# Patient Record
Sex: Male | Born: 1935 | Race: White | Hispanic: No | Marital: Married
Health system: Southern US, Community
[De-identification: ages and names within clinical notes are randomized; demographics above are authoritative.]

---

## 2011-06-23 ENCOUNTER — Emergency Department: Payer: Self-pay | Admitting: Internal Medicine

## 2011-06-23 LAB — COMPREHENSIVE METABOLIC PANEL
Alkaline Phosphatase: 72 U/L (ref 50–136)
Anion Gap: 9 (ref 7–16)
Bilirubin,Total: 0.7 mg/dL (ref 0.2–1.0)
Calcium, Total: 8.8 mg/dL (ref 8.5–10.1)
Chloride: 103 mmol/L (ref 98–107)
Co2: 29 mmol/L (ref 21–32)
EGFR (African American): >60
EGFR (Non-African Amer.): >60
Osmolality: 283 (ref 275–301)
Potassium: 3.8 mmol/L (ref 3.5–5.1)
SGOT(AST): 21 U/L (ref 15–37)
Sodium: 141 mmol/L (ref 136–145)

## 2011-06-23 LAB — URINALYSIS, COMPLETE
Glucose,UR: NEGATIVE mg/dL (ref 0–75)
Nitrite: POSITIVE
Ph: 7 (ref 4.5–8.0)
Transitional Epi: 1

## 2011-06-23 LAB — CBC
MCH: 38.6 pg — ABNORMAL HIGH (ref 26.0–34.0)
MCHC: 35.4 g/dL (ref 32.0–36.0)
MCV: 109 fL — ABNORMAL HIGH (ref 80–100)
Platelet: 156 10*3/uL (ref 150–440)
RBC: 3.13 10*6/uL — ABNORMAL LOW (ref 4.40–5.90)
RDW: 14.5 % (ref 11.5–14.5)

## 2011-06-23 LAB — PROTIME-INR: INR: 1

## 2011-06-25 LAB — URINE CULTURE

## 2011-08-11 ENCOUNTER — Emergency Department: Payer: Self-pay | Admitting: Emergency Medicine

## 2011-08-12 LAB — CBC
HCT: 26.5 % — ABNORMAL LOW (ref 40.0–52.0)
MCH: 38 pg — ABNORMAL HIGH (ref 26.0–34.0)
MCV: 113 fL — ABNORMAL HIGH (ref 80–100)
Platelet: 165 10*3/uL (ref 150–440)
RBC: 2.34 10*6/uL — ABNORMAL LOW (ref 4.40–5.90)
RDW: 16.4 % — ABNORMAL HIGH (ref 11.5–14.5)

## 2011-08-12 LAB — URINALYSIS, COMPLETE
Bilirubin,UR: NEGATIVE
Glucose,UR: 50 mg/dL (ref 0–75)
Nitrite: POSITIVE
RBC,UR: 28 /HPF (ref 0–5)
Specific Gravity: 1.018 (ref 1.003–1.030)
WBC UR: 1326 /HPF (ref 0–5)

## 2011-08-12 LAB — COMPREHENSIVE METABOLIC PANEL
Alkaline Phosphatase: 66 U/L (ref 50–136)
Bilirubin,Total: 1 mg/dL (ref 0.2–1.0)
Calcium, Total: 8.6 mg/dL (ref 8.5–10.1)
Chloride: 104 mmol/L (ref 98–107)
Co2: 29 mmol/L (ref 21–32)
Creatinine: 0.99 mg/dL (ref 0.60–1.30)
EGFR (African American): >60
EGFR (Non-African Amer.): >60
Osmolality: 301 (ref 275–301)
SGOT(AST): 37 U/L (ref 15–37)
SGPT (ALT): 28 U/L
Sodium: 144 mmol/L (ref 136–145)

## 2011-08-12 LAB — DRUG SCREEN, URINE
Amphetamines, Ur Screen: NEGATIVE (ref ?–1000)
Barbiturates, Ur Screen: NEGATIVE (ref ?–200)
Benzodiazepine, Ur Scrn: NEGATIVE (ref ?–200)
Cannabinoid 50 Ng, Ur ~~LOC~~: NEGATIVE (ref ?–50)
MDMA (Ecstasy)Ur Screen: NEGATIVE (ref ?–500)
Methadone, Ur Screen: NEGATIVE (ref ?–300)
Opiate, Ur Screen: NEGATIVE (ref ?–300)
Phencyclidine (PCP) Ur S: NEGATIVE (ref ?–25)
Tricyclic, Ur Screen: NEGATIVE (ref ?–1000)

## 2011-08-12 LAB — TSH: Thyroid Stimulating Horm: 0.44 u[IU]/mL — ABNORMAL LOW

## 2011-08-12 LAB — SALICYLATE LEVEL: Salicylates, Serum: <1.7 mg/dL

## 2011-08-12 LAB — ETHANOL: Ethanol: <3 mg/dL

## 2011-08-13 LAB — CBC
HGB: 10 g/dL — ABNORMAL LOW (ref 13.0–18.0)
MCH: 37.9 pg — ABNORMAL HIGH (ref 26.0–34.0)
MCHC: 33.5 g/dL (ref 32.0–36.0)
MCV: 113 fL — ABNORMAL HIGH (ref 80–100)
Platelet: 209 10*3/uL (ref 150–440)

## 2011-08-14 LAB — VALPROIC ACID LEVEL: Valproic Acid: 8 ug/mL — ABNORMAL LOW

## 2011-08-16 LAB — URINALYSIS, COMPLETE
Bilirubin,UR: NEGATIVE
Glucose,UR: 50 mg/dL (ref 0–75)
Nitrite: NEGATIVE
Ph: 6 (ref 4.5–8.0)
Protein: NEGATIVE
Specific Gravity: 1.015 (ref 1.003–1.030)

## 2011-08-16 LAB — BASIC METABOLIC PANEL
Anion Gap: 8 (ref 7–16)
Calcium, Total: 8.5 mg/dL (ref 8.5–10.1)
Co2: 29 mmol/L (ref 21–32)
EGFR (African American): >60
EGFR (Non-African Amer.): >60
Glucose: 101 mg/dL — ABNORMAL HIGH (ref 65–99)
Osmolality: 290 (ref 275–301)

## 2011-08-16 LAB — CBC
HCT: 32.7 % — ABNORMAL LOW (ref 40.0–52.0)
HGB: 11.1 g/dL — ABNORMAL LOW (ref 13.0–18.0)
MCH: 38 pg — ABNORMAL HIGH (ref 26.0–34.0)
MCHC: 33.9 g/dL (ref 32.0–36.0)
Platelet: 241 10*3/uL (ref 150–440)
RDW: 16.6 % — ABNORMAL HIGH (ref 11.5–14.5)
WBC: 7.5 10*3/uL (ref 3.8–10.6)

## 2012-06-28 DEATH — deceased

## 2013-01-25 IMAGING — CR PELVIS - 1-2 VIEW
1 series · 1 of 1 positions shown · non-contrast
Comparison: none

REASON FOR EXAM: multiple falls
COMMENTS:

PROCEDURE:     DXR - DXR PELVIS AP ONLY  - August 13, 2011  [DATE]
RESULT:     There is no evidence of fracture, dislocation, or malalignment.

[x pelvis]
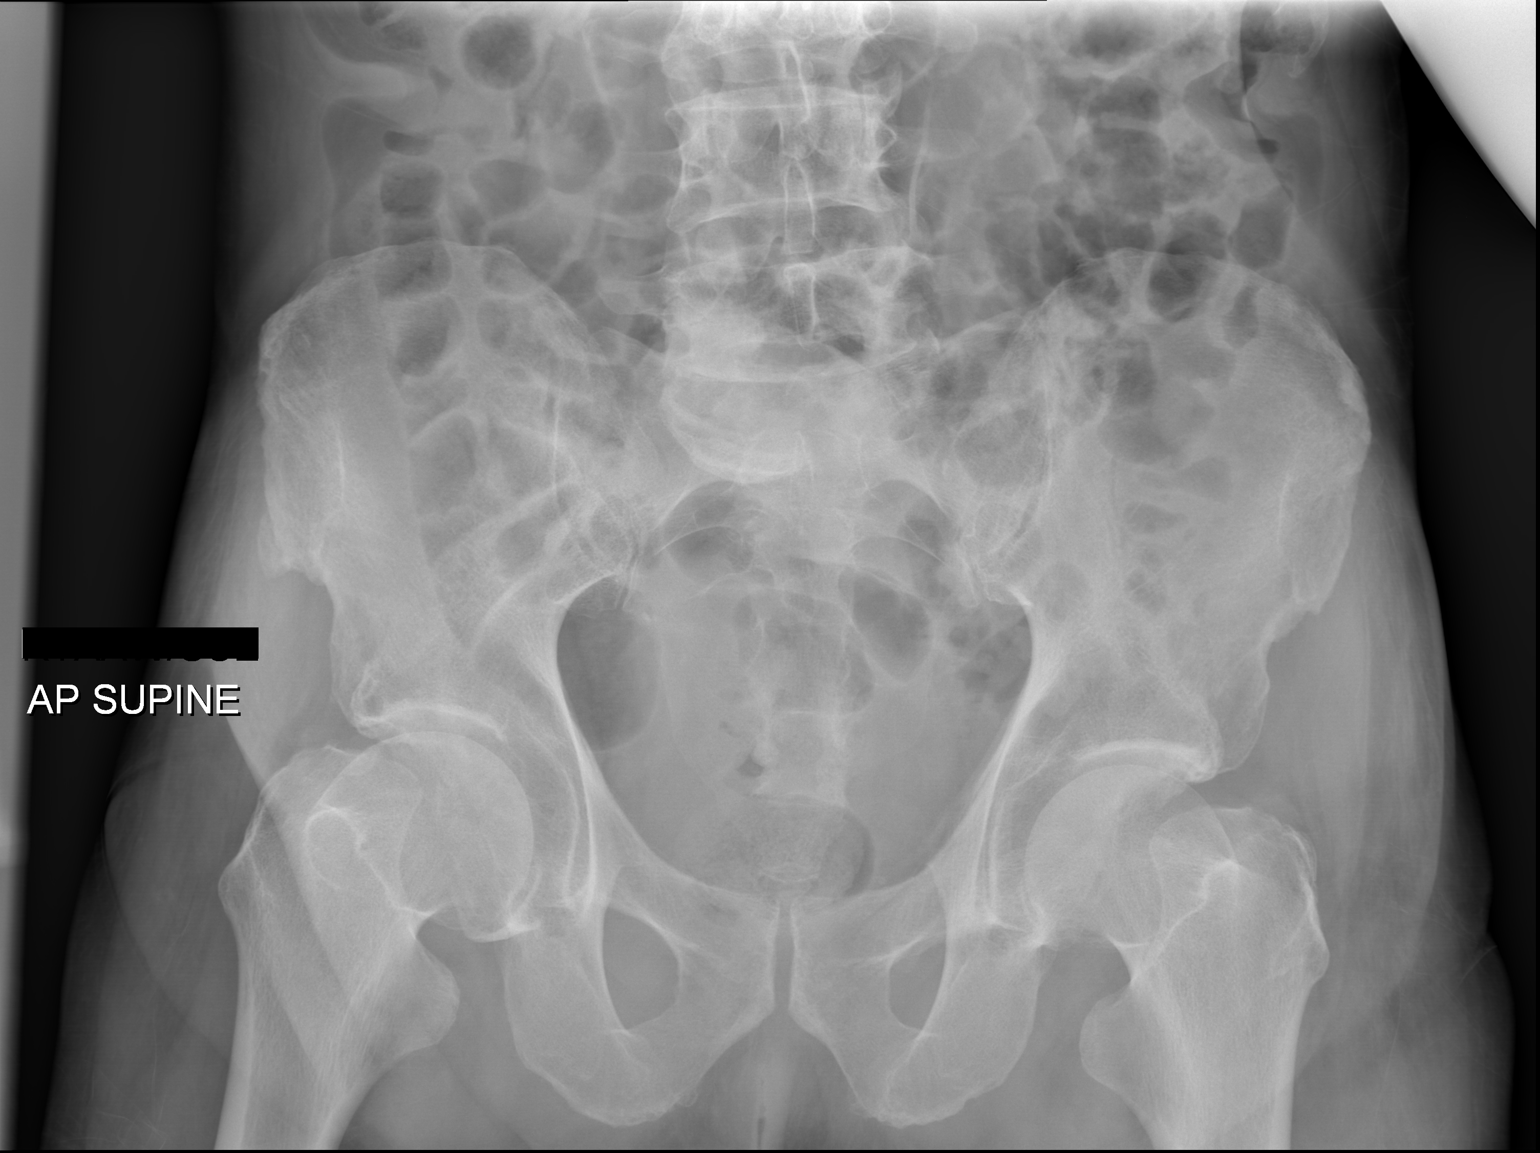

[1 of 1 positions shown; findings below may reference images not displayed]

IMPRESSION: 1. No evidence of acute abnormalities.
2. If there are persistent complaints of pain or persistent clinical
concern, a repeat evaluation in 7-10 days and/or MRI is recommended if
clinically warranted.

## 2013-01-25 IMAGING — CT CT HEAD WITHOUT CONTRAST
4 of 6 series · 16 of 30 positions shown, 17 images · non-contrast
Comparison: none

REASON FOR EXAM: multiple falls
COMMENTS:

PROCEDURE:     CT  - CT HEAD WITHOUT CONTRAST  - August 13, 2011  [DATE]
RESULT:     Comparison: 06/23/2011
TECHNIQUE: Multiple axial images from the foramen magnum to the vertex were
obtained without IV contrast.

[Series 2: without · axial · non-contrast · 0.53mm/px · z∈[-138,-32]mm · 4 of 35 slices shown, 5 images]
[im 7/35  brain]
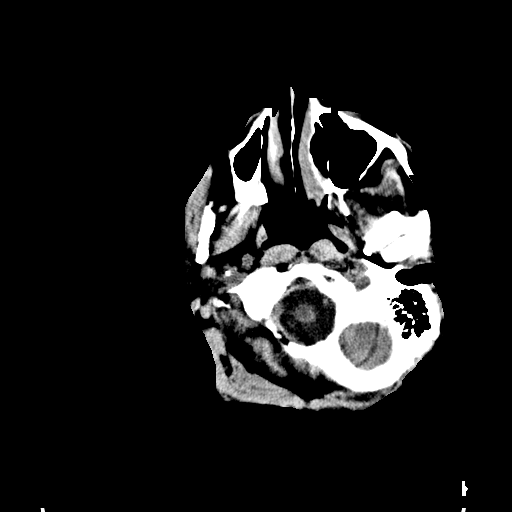
[im 7/35  bone]
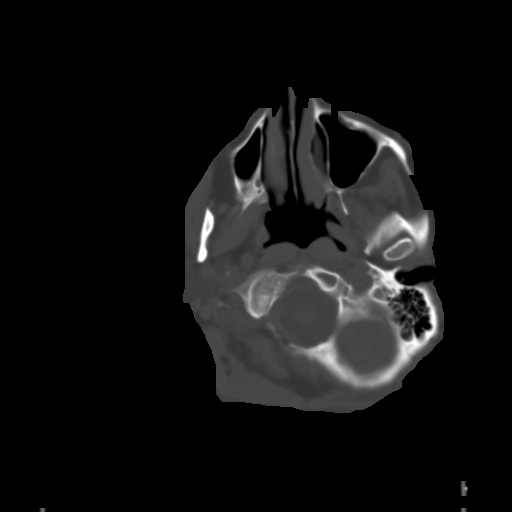
[im 14/35  brain]
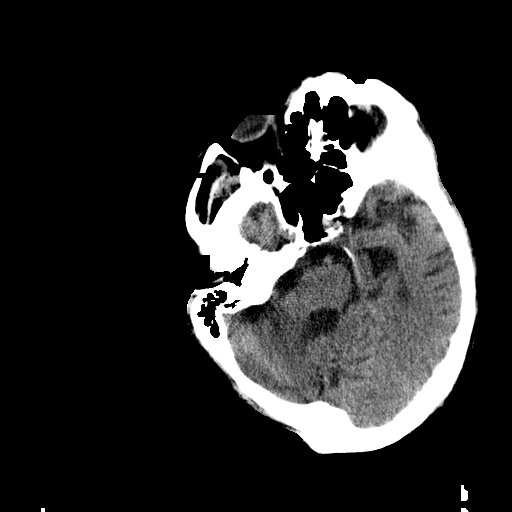
[im 21/35  brain]
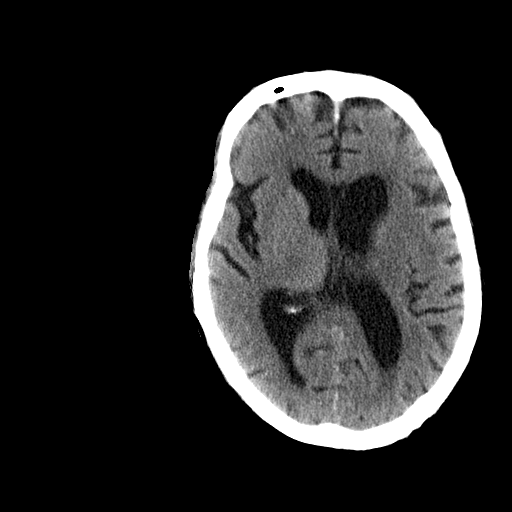
[im 28/35  brain]
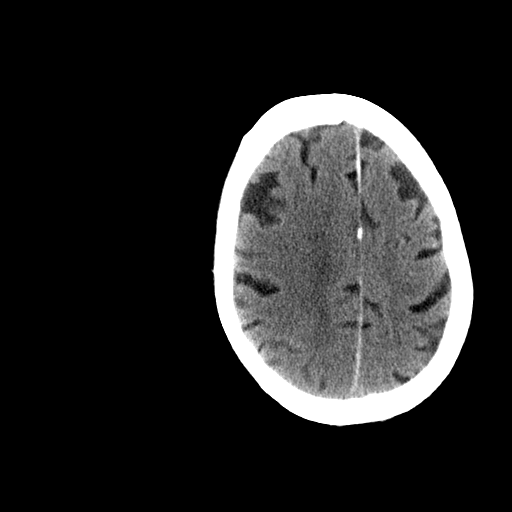

[Series 3: bone · axial · 0.53mm/px · z∈[-138,-32]mm · 4 of 35 slices shown]
[im 7/35  bone]
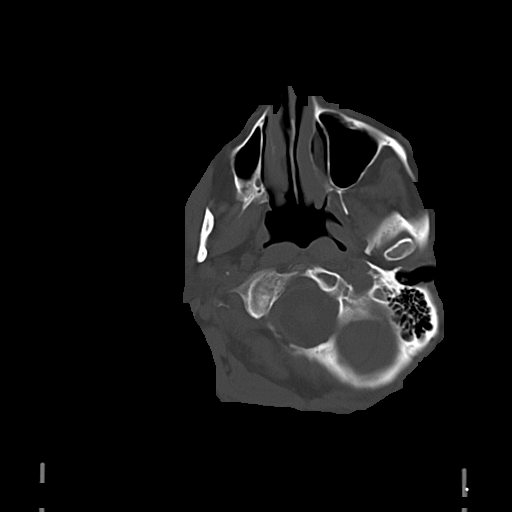
[im 14/35  bone]
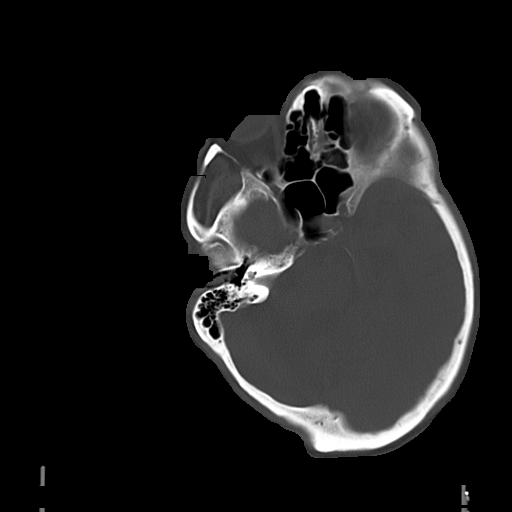
[im 21/35  bone]
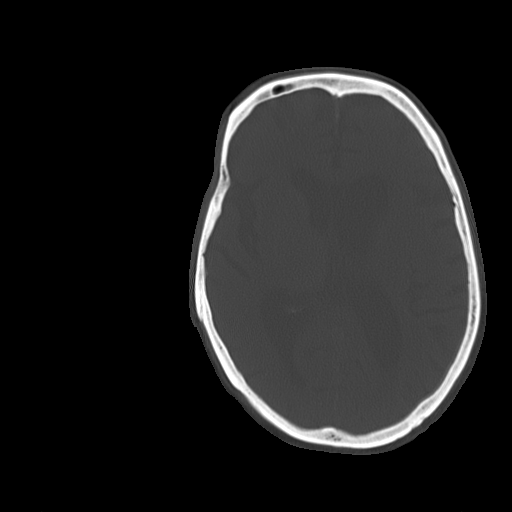
[im 28/35  bone]
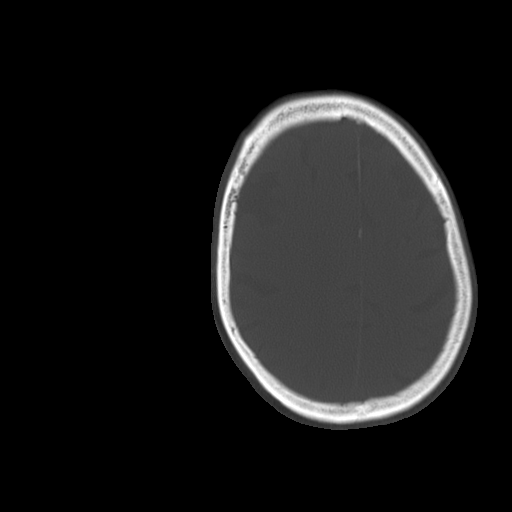

[Series 6: (id) · axial · 0.53mm/px · z∈[-35,+48]mm · 4 of 30 slices shown (1 of 2)]
[im 6/30  brain]
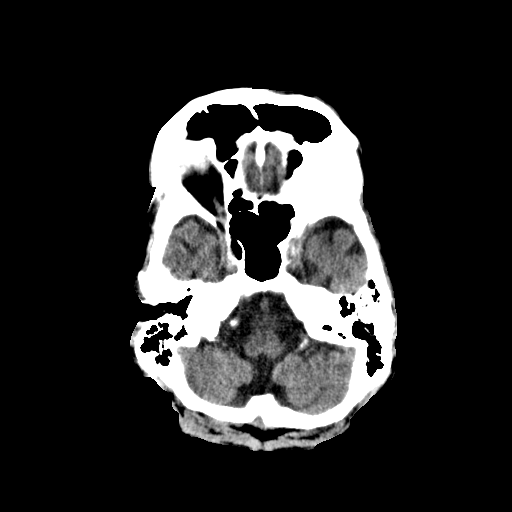
[im 12/30  brain]
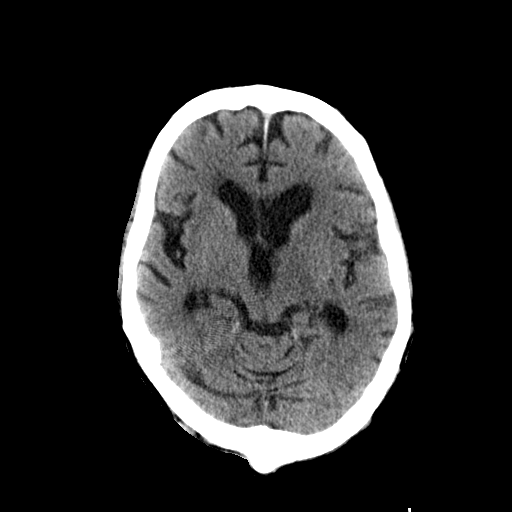
[im 18/30  brain]
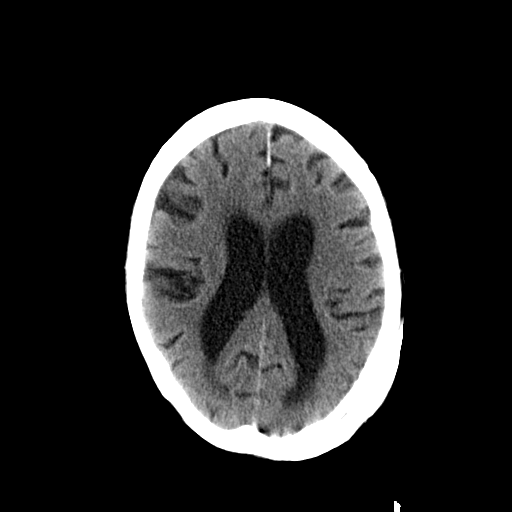
[im 24/30  brain]
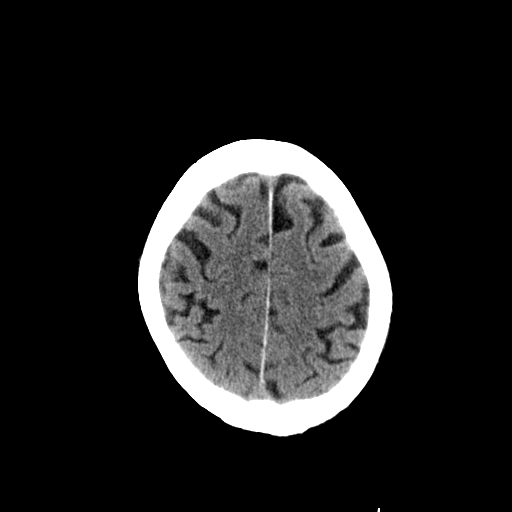

[Series 7: (id) · axial · 0.53mm/px · z∈[-55,+33]mm · 4 of 32 slices shown (2 of 2)]
[im 7/32  brain]
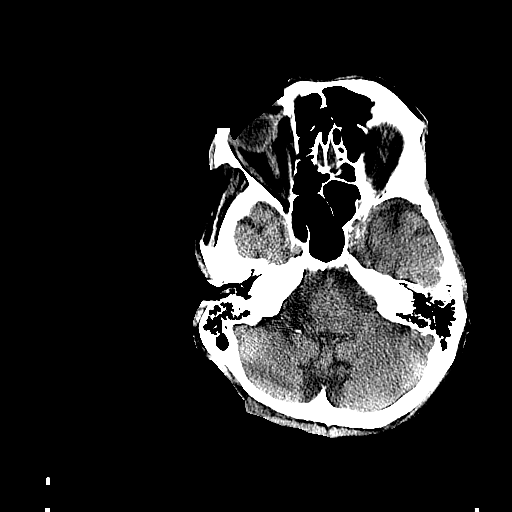
[im 13/32  brain]
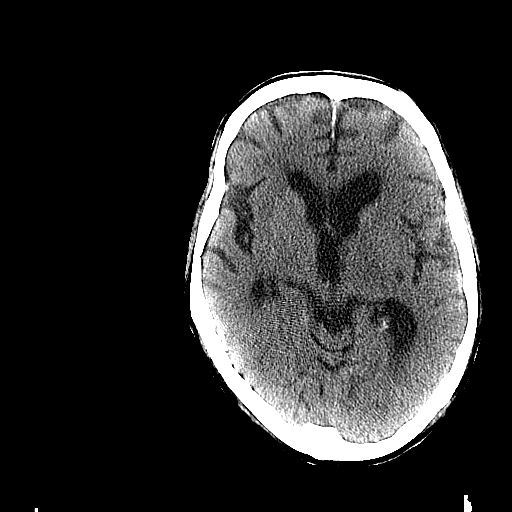
[im 19/32  brain]
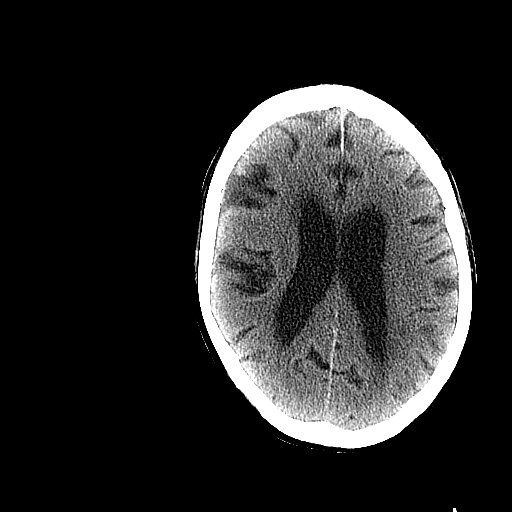
[im 25/32  brain]
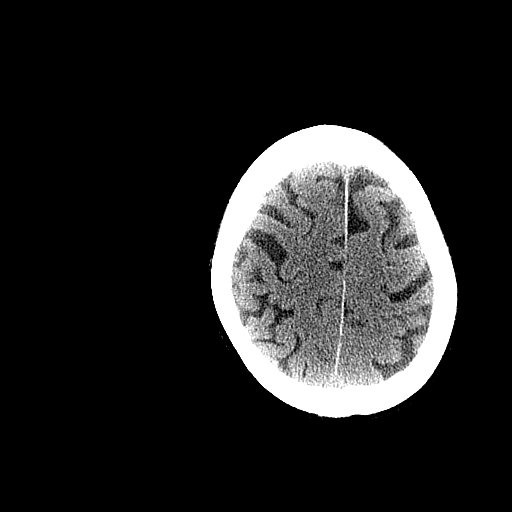

[16 of 30 positions shown; findings below may reference images not displayed]

FINDINGS: There is no evidence of mass effect, midline shift, or extra-axial fluid
collections.  There is no evidence of a space-occupying lesion or
intracranial hemorrhage. There is no evidence of a cortical-based area of
acute infarction. There is generalized cerebral atrophy. There is
periventricular white matter low attenuation likely secondary to
microangiopathy.

The ventricles and sulci are appropriate for the patient's age. The basal
cisterns are patent.

Visualized portions of the orbits are unremarkable. The visualized portions
of the paranasal sinuses and mastoid air cells are unremarkable.

The osseous structures are unremarkable.
IMPRESSION: No acute intracranial process.
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
